# Patient Record
Sex: Female | Born: 1970 | Hispanic: Refuse to answer | Marital: Married | Smoking: Never smoker
Health system: Southern US, Community
[De-identification: ages and names within clinical notes are randomized; demographics above are authoritative.]

## PROBLEM LIST (undated history)

## (undated) DIAGNOSIS — Z862 Personal history of diseases of the blood and blood-forming organs and certain disorders involving the immune mechanism: Secondary | ICD-10-CM

## (undated) DIAGNOSIS — F419 Anxiety disorder, unspecified: Secondary | ICD-10-CM

## (undated) HISTORY — DX: Anxiety disorder, unspecified: F41.9

## (undated) HISTORY — PX: LYMPH NODE DISSECTION: SHX5087

## (undated) HISTORY — PX: KNEE ARTHROSCOPY: SUR90

## (undated) HISTORY — PX: ABDOMINAL HERNIA REPAIR: SHX539

## (undated) HISTORY — PX: TUBAL LIGATION: SHX77

---

## 2019-07-04 DIAGNOSIS — D225 Melanocytic nevi of trunk: Secondary | ICD-10-CM | POA: Diagnosis not present

## 2019-07-04 DIAGNOSIS — D2372 Other benign neoplasm of skin of left lower limb, including hip: Secondary | ICD-10-CM | POA: Diagnosis not present

## 2019-07-04 DIAGNOSIS — L821 Other seborrheic keratosis: Secondary | ICD-10-CM | POA: Diagnosis not present

## 2019-07-04 DIAGNOSIS — D1801 Hemangioma of skin and subcutaneous tissue: Secondary | ICD-10-CM | POA: Diagnosis not present

## 2019-08-28 DIAGNOSIS — Z1211 Encounter for screening for malignant neoplasm of colon: Secondary | ICD-10-CM | POA: Diagnosis not present

## 2019-09-11 DIAGNOSIS — M25511 Pain in right shoulder: Secondary | ICD-10-CM | POA: Diagnosis not present

## 2019-09-11 DIAGNOSIS — M546 Pain in thoracic spine: Secondary | ICD-10-CM | POA: Diagnosis not present

## 2019-09-11 DIAGNOSIS — M7521 Bicipital tendinitis, right shoulder: Secondary | ICD-10-CM | POA: Diagnosis not present

## 2019-09-25 DIAGNOSIS — S46011D Strain of muscle(s) and tendon(s) of the rotator cuff of right shoulder, subsequent encounter: Secondary | ICD-10-CM | POA: Diagnosis not present

## 2019-09-25 DIAGNOSIS — S46811D Strain of other muscles, fascia and tendons at shoulder and upper arm level, right arm, subsequent encounter: Secondary | ICD-10-CM | POA: Diagnosis not present

## 2019-09-25 DIAGNOSIS — M546 Pain in thoracic spine: Secondary | ICD-10-CM | POA: Diagnosis not present

## 2019-09-27 ENCOUNTER — Other Ambulatory Visit: Payer: Self-pay | Admitting: Obstetrics and Gynecology

## 2019-09-27 DIAGNOSIS — Z1151 Encounter for screening for human papillomavirus (HPV): Secondary | ICD-10-CM | POA: Diagnosis not present

## 2019-09-27 DIAGNOSIS — Z01419 Encounter for gynecological examination (general) (routine) without abnormal findings: Secondary | ICD-10-CM | POA: Diagnosis not present

## 2019-09-27 DIAGNOSIS — N6002 Solitary cyst of left breast: Secondary | ICD-10-CM

## 2019-09-27 DIAGNOSIS — Z124 Encounter for screening for malignant neoplasm of cervix: Secondary | ICD-10-CM | POA: Diagnosis not present

## 2019-09-27 DIAGNOSIS — Z6824 Body mass index (BMI) 24.0-24.9, adult: Secondary | ICD-10-CM | POA: Diagnosis not present

## 2019-09-27 DIAGNOSIS — Z130 Encounter for screening for diseases of the blood and blood-forming organs and certain disorders involving the immune mechanism: Secondary | ICD-10-CM | POA: Diagnosis not present

## 2019-09-27 DIAGNOSIS — Z3202 Encounter for pregnancy test, result negative: Secondary | ICD-10-CM | POA: Diagnosis not present

## 2019-10-01 DIAGNOSIS — S46011D Strain of muscle(s) and tendon(s) of the rotator cuff of right shoulder, subsequent encounter: Secondary | ICD-10-CM | POA: Diagnosis not present

## 2019-10-01 DIAGNOSIS — S46811D Strain of other muscles, fascia and tendons at shoulder and upper arm level, right arm, subsequent encounter: Secondary | ICD-10-CM | POA: Diagnosis not present

## 2019-10-01 DIAGNOSIS — M546 Pain in thoracic spine: Secondary | ICD-10-CM | POA: Diagnosis not present

## 2019-10-02 DIAGNOSIS — M25511 Pain in right shoulder: Secondary | ICD-10-CM | POA: Diagnosis not present

## 2019-10-03 DIAGNOSIS — Z1211 Encounter for screening for malignant neoplasm of colon: Secondary | ICD-10-CM | POA: Diagnosis not present

## 2019-10-05 DIAGNOSIS — S46811D Strain of other muscles, fascia and tendons at shoulder and upper arm level, right arm, subsequent encounter: Secondary | ICD-10-CM | POA: Diagnosis not present

## 2019-10-05 DIAGNOSIS — S46011D Strain of muscle(s) and tendon(s) of the rotator cuff of right shoulder, subsequent encounter: Secondary | ICD-10-CM | POA: Diagnosis not present

## 2019-10-05 DIAGNOSIS — M546 Pain in thoracic spine: Secondary | ICD-10-CM | POA: Diagnosis not present

## 2019-10-12 DIAGNOSIS — S46811D Strain of other muscles, fascia and tendons at shoulder and upper arm level, right arm, subsequent encounter: Secondary | ICD-10-CM | POA: Diagnosis not present

## 2019-10-12 DIAGNOSIS — M546 Pain in thoracic spine: Secondary | ICD-10-CM | POA: Diagnosis not present

## 2019-10-12 DIAGNOSIS — S46011D Strain of muscle(s) and tendon(s) of the rotator cuff of right shoulder, subsequent encounter: Secondary | ICD-10-CM | POA: Diagnosis not present

## 2019-10-22 ENCOUNTER — Other Ambulatory Visit: Payer: Self-pay

## 2019-10-23 ENCOUNTER — Ambulatory Visit
Admission: RE | Admit: 2019-10-23 | Discharge: 2019-10-23 | Disposition: A | Discharge status: Home / Self Care | Payer: BC Managed Care – PPO | Source: Ambulatory Visit | Attending: Obstetrics and Gynecology | Admitting: Obstetrics and Gynecology

## 2019-10-23 ENCOUNTER — Other Ambulatory Visit: Payer: Self-pay

## 2019-10-23 DIAGNOSIS — N6002 Solitary cyst of left breast: Secondary | ICD-10-CM

## 2019-10-23 DIAGNOSIS — S46811D Strain of other muscles, fascia and tendons at shoulder and upper arm level, right arm, subsequent encounter: Secondary | ICD-10-CM | POA: Diagnosis not present

## 2019-10-23 DIAGNOSIS — D171 Benign lipomatous neoplasm of skin and subcutaneous tissue of trunk: Secondary | ICD-10-CM | POA: Diagnosis not present

## 2019-10-23 DIAGNOSIS — S46011D Strain of muscle(s) and tendon(s) of the rotator cuff of right shoulder, subsequent encounter: Secondary | ICD-10-CM | POA: Diagnosis not present

## 2019-10-23 DIAGNOSIS — M546 Pain in thoracic spine: Secondary | ICD-10-CM | POA: Diagnosis not present

## 2019-10-30 DIAGNOSIS — S46011D Strain of muscle(s) and tendon(s) of the rotator cuff of right shoulder, subsequent encounter: Secondary | ICD-10-CM | POA: Diagnosis not present

## 2019-10-30 DIAGNOSIS — S46811D Strain of other muscles, fascia and tendons at shoulder and upper arm level, right arm, subsequent encounter: Secondary | ICD-10-CM | POA: Diagnosis not present

## 2019-10-30 DIAGNOSIS — M546 Pain in thoracic spine: Secondary | ICD-10-CM | POA: Diagnosis not present

## 2019-11-05 DIAGNOSIS — Z13220 Encounter for screening for lipoid disorders: Secondary | ICD-10-CM | POA: Diagnosis not present

## 2019-11-05 DIAGNOSIS — Z23 Encounter for immunization: Secondary | ICD-10-CM | POA: Diagnosis not present

## 2019-11-05 DIAGNOSIS — Z0000 Encounter for general adult medical examination without abnormal findings: Secondary | ICD-10-CM | POA: Diagnosis not present

## 2019-12-17 DIAGNOSIS — M4120 Other idiopathic scoliosis, site unspecified: Secondary | ICD-10-CM | POA: Diagnosis not present

## 2019-12-17 DIAGNOSIS — R6889 Other general symptoms and signs: Secondary | ICD-10-CM | POA: Diagnosis not present

## 2019-12-17 DIAGNOSIS — G8929 Other chronic pain: Secondary | ICD-10-CM | POA: Diagnosis not present

## 2019-12-17 DIAGNOSIS — M25511 Pain in right shoulder: Secondary | ICD-10-CM | POA: Diagnosis not present

## 2019-12-31 DIAGNOSIS — M419 Scoliosis, unspecified: Secondary | ICD-10-CM | POA: Diagnosis not present

## 2019-12-31 DIAGNOSIS — M546 Pain in thoracic spine: Secondary | ICD-10-CM | POA: Diagnosis not present

## 2019-12-31 DIAGNOSIS — M7541 Impingement syndrome of right shoulder: Secondary | ICD-10-CM | POA: Diagnosis not present

## 2019-12-31 DIAGNOSIS — M25511 Pain in right shoulder: Secondary | ICD-10-CM | POA: Diagnosis not present

## 2020-03-17 DIAGNOSIS — M7521 Bicipital tendinitis, right shoulder: Secondary | ICD-10-CM | POA: Diagnosis not present

## 2020-03-17 DIAGNOSIS — M419 Scoliosis, unspecified: Secondary | ICD-10-CM | POA: Diagnosis not present

## 2020-03-27 DIAGNOSIS — M25511 Pain in right shoulder: Secondary | ICD-10-CM | POA: Diagnosis not present

## 2020-04-01 DIAGNOSIS — M419 Scoliosis, unspecified: Secondary | ICD-10-CM | POA: Diagnosis not present

## 2020-04-01 DIAGNOSIS — M7521 Bicipital tendinitis, right shoulder: Secondary | ICD-10-CM | POA: Diagnosis not present

## 2020-04-01 DIAGNOSIS — M25511 Pain in right shoulder: Secondary | ICD-10-CM | POA: Diagnosis not present

## 2020-05-05 DIAGNOSIS — M25511 Pain in right shoulder: Secondary | ICD-10-CM | POA: Diagnosis not present

## 2020-07-10 DIAGNOSIS — L821 Other seborrheic keratosis: Secondary | ICD-10-CM | POA: Diagnosis not present

## 2020-07-10 DIAGNOSIS — D225 Melanocytic nevi of trunk: Secondary | ICD-10-CM | POA: Diagnosis not present

## 2020-07-10 DIAGNOSIS — L814 Other melanin hyperpigmentation: Secondary | ICD-10-CM | POA: Diagnosis not present

## 2020-07-10 DIAGNOSIS — L853 Xerosis cutis: Secondary | ICD-10-CM | POA: Diagnosis not present

## 2021-04-17 ENCOUNTER — Other Ambulatory Visit: Payer: Self-pay | Admitting: Obstetrics and Gynecology

## 2021-04-17 DIAGNOSIS — Z6822 Body mass index (BMI) 22.0-22.9, adult: Secondary | ICD-10-CM | POA: Diagnosis not present

## 2021-04-17 DIAGNOSIS — Z1151 Encounter for screening for human papillomavirus (HPV): Secondary | ICD-10-CM | POA: Diagnosis not present

## 2021-04-17 DIAGNOSIS — Z01419 Encounter for gynecological examination (general) (routine) without abnormal findings: Secondary | ICD-10-CM | POA: Diagnosis not present

## 2021-04-17 DIAGNOSIS — Z130 Encounter for screening for diseases of the blood and blood-forming organs and certain disorders involving the immune mechanism: Secondary | ICD-10-CM | POA: Diagnosis not present

## 2021-04-17 DIAGNOSIS — Z124 Encounter for screening for malignant neoplasm of cervix: Secondary | ICD-10-CM | POA: Diagnosis not present

## 2021-04-17 DIAGNOSIS — N6320 Unspecified lump in the left breast, unspecified quadrant: Secondary | ICD-10-CM

## 2021-05-18 ENCOUNTER — Ambulatory Visit
Admission: RE | Admit: 2021-05-18 | Discharge: 2021-05-18 | Disposition: A | Discharge status: Home / Self Care | Payer: BC Managed Care – PPO | Source: Ambulatory Visit | Attending: Obstetrics and Gynecology | Admitting: Obstetrics and Gynecology

## 2021-05-18 ENCOUNTER — Other Ambulatory Visit: Payer: Self-pay

## 2021-05-18 DIAGNOSIS — N6320 Unspecified lump in the left breast, unspecified quadrant: Secondary | ICD-10-CM

## 2021-05-18 DIAGNOSIS — R922 Inconclusive mammogram: Secondary | ICD-10-CM | POA: Diagnosis not present

## 2021-10-17 IMAGING — MG DIGITAL DIAGNOSTIC BILAT W/ TOMO W/ CAD
6 of 12 series · 6 of 36 positions shown · non-contrast
Comparison: Previous exam(s).

CLINICAL DATA: 49-year-old female with previously demonstrated left
breast lipoma presents with questionable enlargement of the palpable
area.

EXAM:
DIGITAL DIAGNOSTIC BILATERAL MAMMOGRAM WITH TOMOSYNTHESIS AND CAD;
ULTRASOUND LEFT BREAST LIMITED
TECHNIQUE: Bilateral digital diagnostic mammography and breast tomosynthesis
was performed. The images were evaluated with computer-aided
detection.; Targeted ultrasound examination of the left breast was
performed

[L MLO synth-2D (1 of 2)]
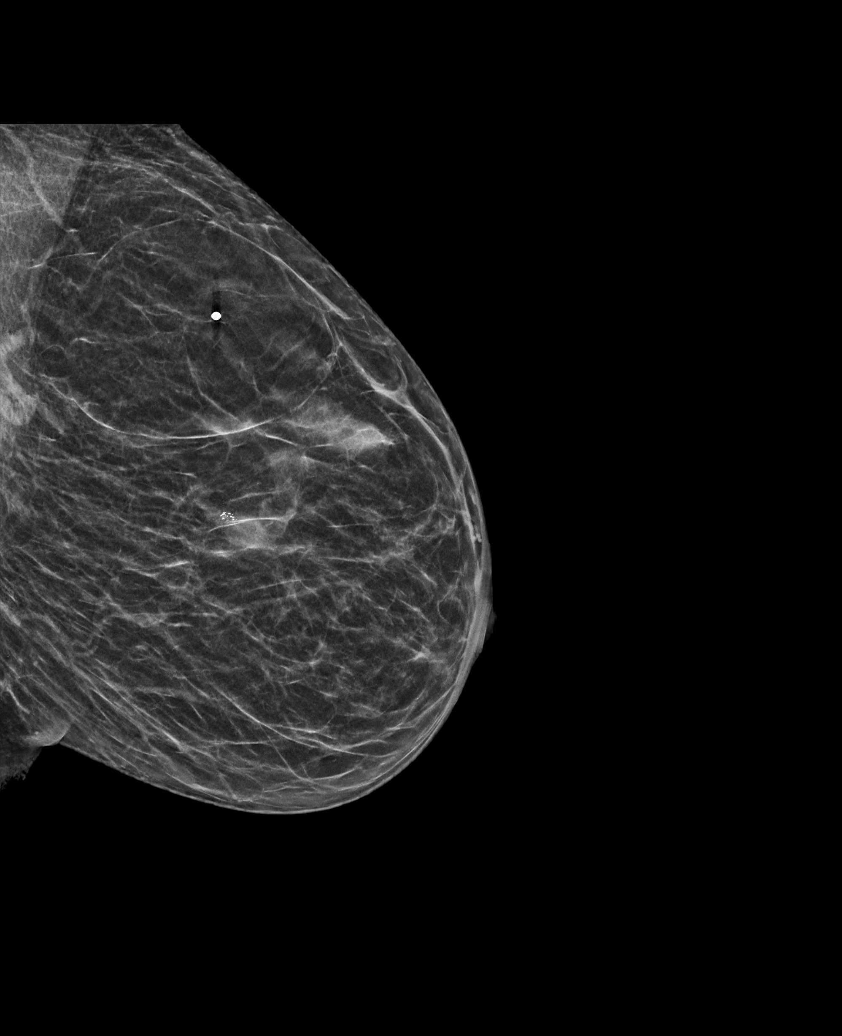

[L TAN synth-2D]
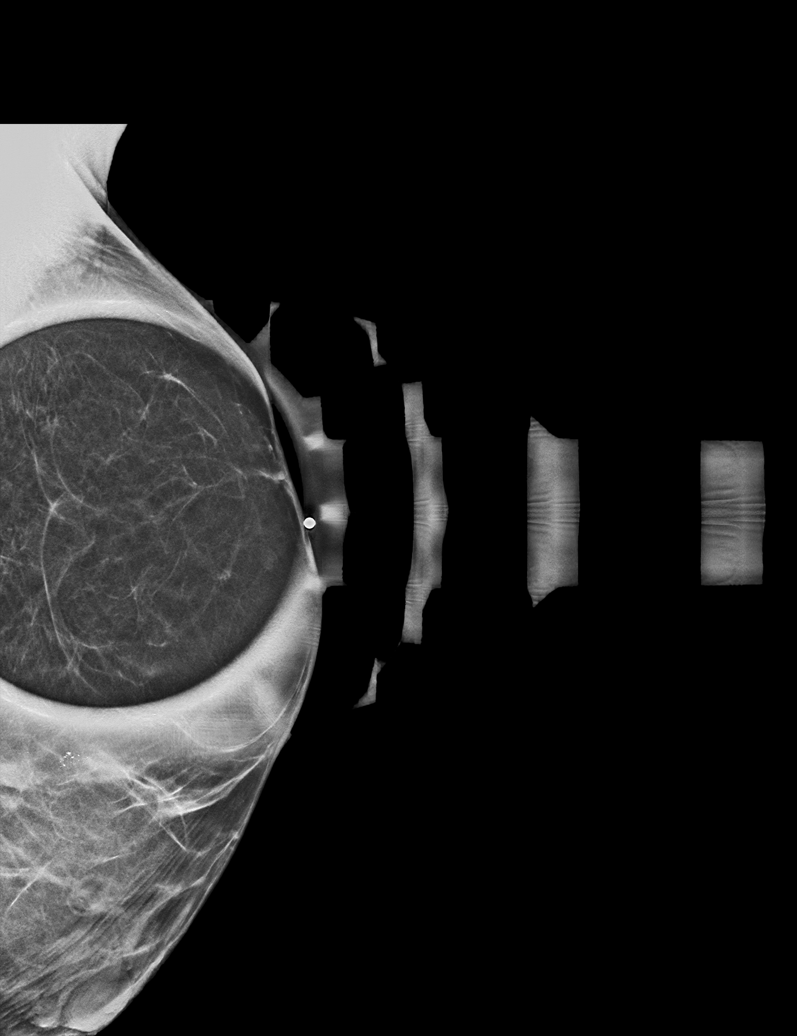

[L MLO synth-2D (2 of 2)]
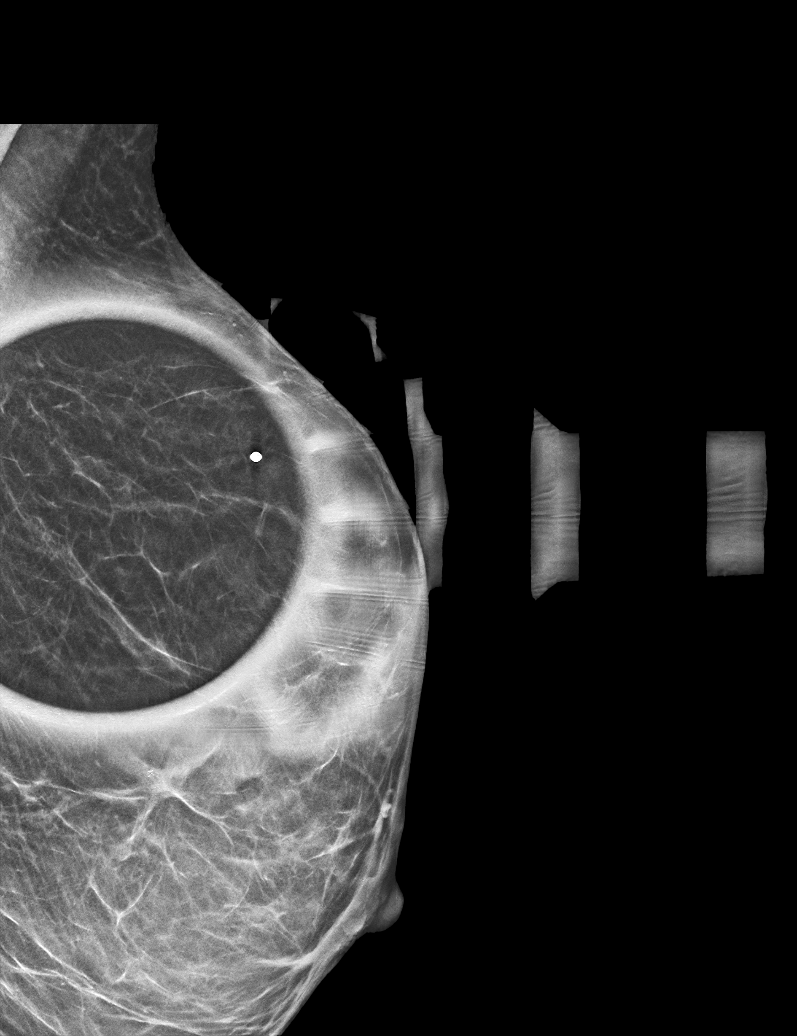

[R MLO synth-2D]
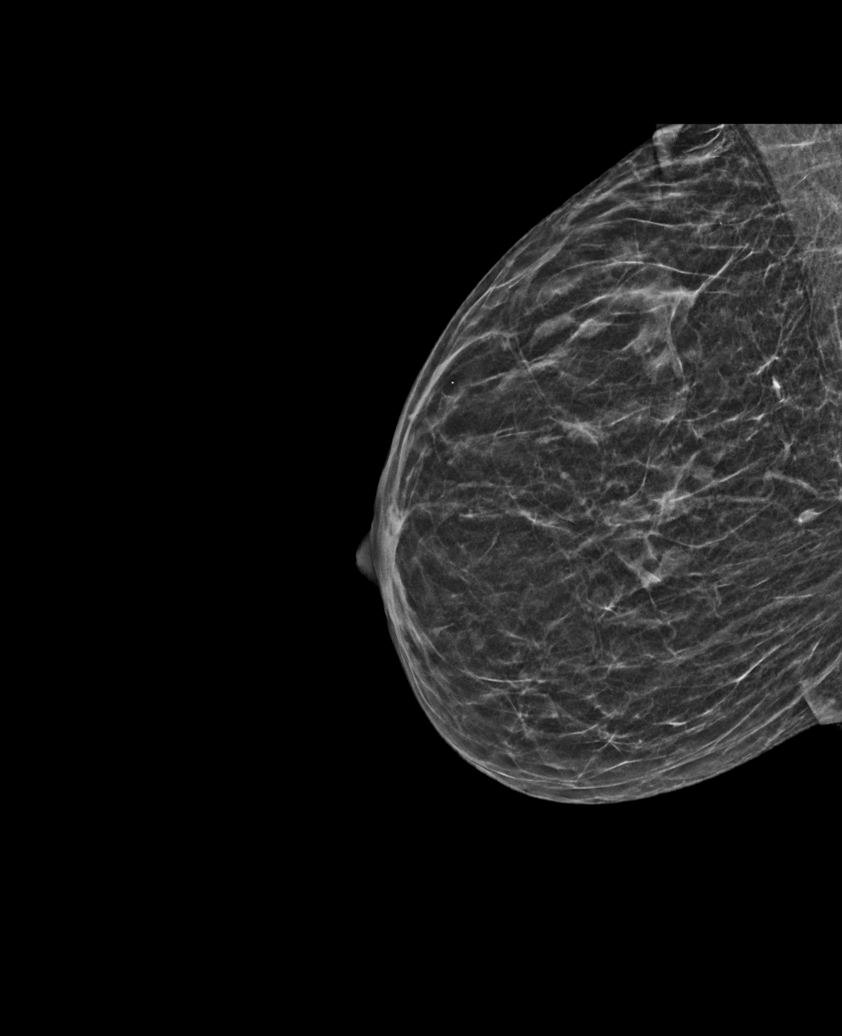

[R CC synth-2D]
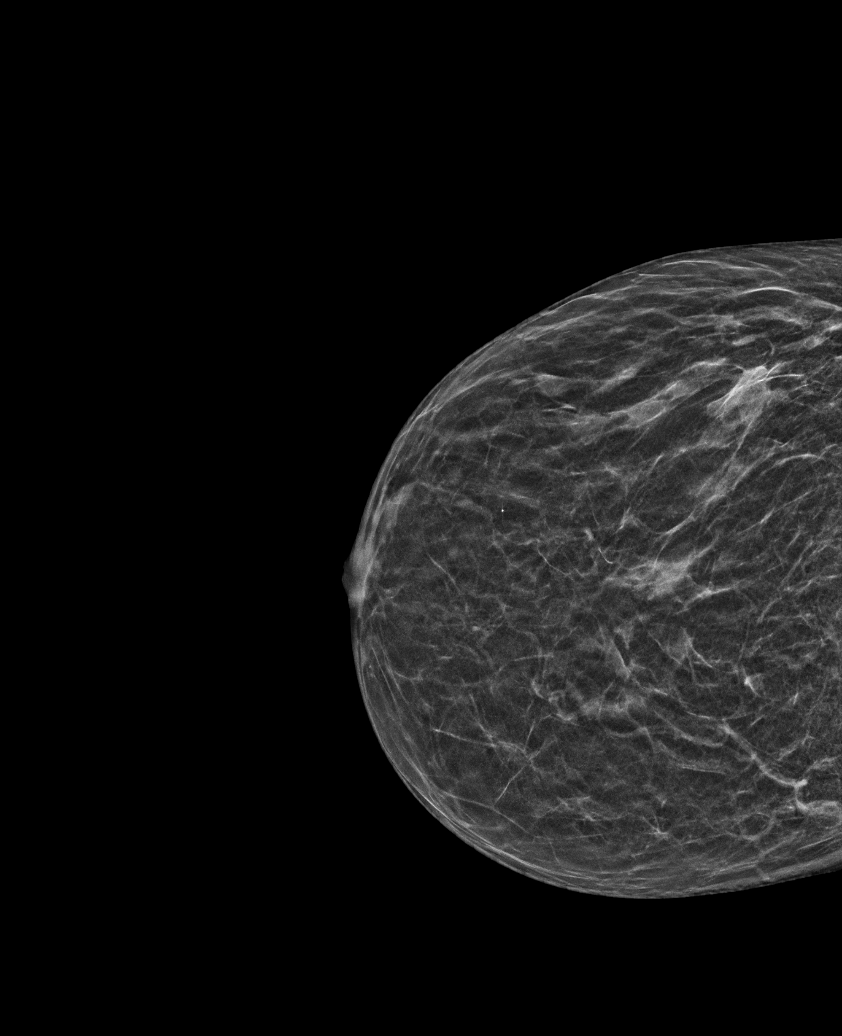

[L CC synth-2D]
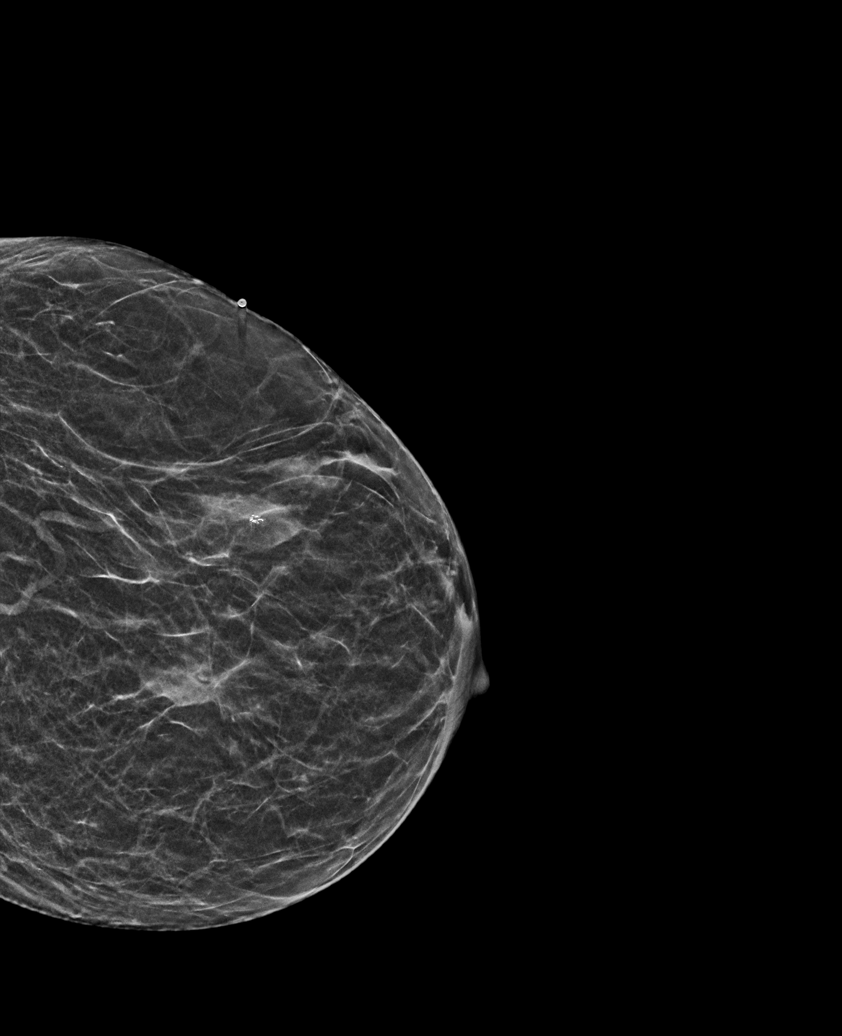

[6 of 36 positions shown; findings below may reference images not displayed]

ACR Breast Density Category b: There are scattered areas of
fibroglandular density.
FINDINGS: A radiopaque BB was placed at the site of the patient's palpable
left breast lump. An oval, circumscribed fat density mass,
consistent with a lipoma, is seen deep to the radiopaque BB. Is
mammographically unchanged from priors. No other new or suspicious
findings in either breast.

Targeted ultrasound is performed, showing a circumscribed mass at
the 2 o'clock position 8 cm from the nipple. It measures 0.6 x 1.8 x
6.1 cm (previously 5.6 x 2.8 x 7.2 cm).
IMPRESSION: 1. No mammographic evidence of malignancy in either breast.
2. Benign left breast lipoma, unchanged in size.

RECOMMENDATION:
1. Clinical follow-up recommended for the patient's left breast
mass.
2.  Screening mammogram in one year.(Code:BE-9-78W)

I have discussed the findings and recommendations with the patient.
If applicable, a reminder letter will be sent to the patient
regarding the next appointment.

BI-RADS CATEGORY  2: Benign.

## 2021-10-26 DIAGNOSIS — L821 Other seborrheic keratosis: Secondary | ICD-10-CM | POA: Diagnosis not present

## 2021-10-26 DIAGNOSIS — D235 Other benign neoplasm of skin of trunk: Secondary | ICD-10-CM | POA: Diagnosis not present

## 2021-10-26 DIAGNOSIS — L814 Other melanin hyperpigmentation: Secondary | ICD-10-CM | POA: Diagnosis not present

## 2022-06-08 DIAGNOSIS — R5383 Other fatigue: Secondary | ICD-10-CM | POA: Diagnosis not present

## 2022-06-08 DIAGNOSIS — Z130 Encounter for screening for diseases of the blood and blood-forming organs and certain disorders involving the immune mechanism: Secondary | ICD-10-CM | POA: Diagnosis not present

## 2022-06-08 DIAGNOSIS — Z1389 Encounter for screening for other disorder: Secondary | ICD-10-CM | POA: Diagnosis not present

## 2022-06-08 DIAGNOSIS — N6320 Unspecified lump in the left breast, unspecified quadrant: Secondary | ICD-10-CM | POA: Diagnosis not present

## 2022-06-08 DIAGNOSIS — Z01419 Encounter for gynecological examination (general) (routine) without abnormal findings: Secondary | ICD-10-CM | POA: Diagnosis not present

## 2022-06-10 ENCOUNTER — Other Ambulatory Visit: Payer: Self-pay | Admitting: Obstetrics and Gynecology

## 2022-06-10 DIAGNOSIS — N63 Unspecified lump in unspecified breast: Secondary | ICD-10-CM

## 2022-06-20 NOTE — Progress Notes (Unsigned)
   New Patient Office Visit  Subjective    Patient ID: Jillian Howard, female    DOB: October 18, 1971  Age: 51 y.o. MRN: [id number]  CC: No chief complaint on file.   HPI Jillian Howard presents to establish care ***  No outpatient encounter medications on file as of 06/21/2022.   No facility-administered encounter medications on file as of 06/21/2022.    No past medical history on file.  No past surgical history on file.  Family History  Problem Relation Age of Onset   Breast cancer Neg Hx     Social History   Socioeconomic History   Marital status: Married    Spouse name: Not on file   Number of children: Not on file   Years of education: Not on file   Highest education level: Not on file  Occupational History   Not on file  Tobacco Use   Smoking status: Not on file   Smokeless tobacco: Not on file  Substance and Sexual Activity   Alcohol use: Not on file   Drug use: Not on file   Sexual activity: Not on file  Other Topics Concern   Not on file  Social History Narrative   Not on file   Social Determinants of Health   Financial Resource Strain: Not on file  Food Insecurity: Not on file  Transportation Needs: Not on file  Physical Activity: Not on file  Stress: Not on file  Social Connections: Not on file  Intimate Partner Violence: Not on file    ROS      Objective    There were no vitals taken for this visit.  Physical Exam  {Labs (Optional):23779}    Assessment & Plan:   Problem List Items Addressed This Visit   None   No follow-ups on file.   Charlton Amor, DO

## 2022-06-21 ENCOUNTER — Ambulatory Visit: Payer: BC Managed Care – PPO | Admitting: Family Medicine

## 2022-06-21 ENCOUNTER — Encounter: Payer: Self-pay | Admitting: Family Medicine

## 2022-06-21 VITALS — BP 94/62 | HR 69 | Ht 62.5 in | Wt 147.0 lb

## 2022-06-21 DIAGNOSIS — R635 Abnormal weight gain: Secondary | ICD-10-CM

## 2022-06-21 DIAGNOSIS — R5383 Other fatigue: Secondary | ICD-10-CM | POA: Diagnosis not present

## 2022-06-21 DIAGNOSIS — F439 Reaction to severe stress, unspecified: Secondary | ICD-10-CM

## 2022-06-21 DIAGNOSIS — E559 Vitamin D deficiency, unspecified: Secondary | ICD-10-CM

## 2022-06-21 DIAGNOSIS — Z862 Personal history of diseases of the blood and blood-forming organs and certain disorders involving the immune mechanism: Secondary | ICD-10-CM

## 2022-06-21 NOTE — Assessment & Plan Note (Signed)
-   pt has hx of anemia - will order CBC and iron studies since she is currently not on any therapy  - also wondering if fatigue is related to low iron levels.

## 2022-06-21 NOTE — Assessment & Plan Note (Signed)
-   pt has hx of vit d deficiency and has not been taking any treatment coupled with fatigue am curious if this is what is causing low energy levels.

## 2022-06-21 NOTE — Assessment & Plan Note (Signed)
-   have ordered TSH and free T4  - likely weight gain is from little sleep--gets 3 hours a night, stressed with work and taking care of the children

## 2022-06-21 NOTE — Assessment & Plan Note (Signed)
-   pt in a high stress situation with work and I believe nausea is related - there is a likelihood that she has given herself a stress ulcer - recommend two weeks of ppi treatment to see if we can get symptom improvement. Vague nausea and some occasional abdominal pain point to ulcer - have given pt recommendations.  - discussed and counseled on ways to reduce stress--made a preliminary plan for her to call her mom and see if she can help  - have sent referral to psych for therapy

## 2022-06-22 DIAGNOSIS — D259 Leiomyoma of uterus, unspecified: Secondary | ICD-10-CM | POA: Diagnosis not present

## 2022-06-22 LAB — CBC
HCT: 37.5 % (ref 35.0–45.0)
Hemoglobin: 12.7 g/dL (ref 11.7–15.5)
MCH: 30.5 pg (ref 27.0–33.0)
MCHC: 33.9 g/dL (ref 32.0–36.0)
MCV: 90.1 fL (ref 80.0–100.0)
MPV: 11.5 fL (ref 7.5–12.5)
Platelets: 266 10*3/uL (ref 140–400)
RBC: 4.16 10*6/uL (ref 3.80–5.10)
RDW: 12.5 % (ref 11.0–15.0)
WBC: 6.9 10*3/uL (ref 3.8–10.8)

## 2022-06-22 LAB — TSH+FREE T4: TSH W/REFLEX TO FT4: 1.5 mIU/L

## 2022-06-22 LAB — IRON,TIBC AND FERRITIN PANEL
%SAT: 42 % (calc) (ref 16–45)
Ferritin: 33 ng/mL (ref 16–232)
Iron: 113 ug/dL (ref 45–160)
TIBC: 268 mcg/dL (calc) (ref 250–450)

## 2022-06-22 LAB — VITAMIN D 25 HYDROXY (VIT D DEFICIENCY, FRACTURES): Vit D, 25-Hydroxy: 42 ng/mL (ref 30–100)

## 2022-06-25 ENCOUNTER — Ambulatory Visit: Payer: BC Managed Care – PPO

## 2022-06-25 ENCOUNTER — Ambulatory Visit
Admission: RE | Admit: 2022-06-25 | Discharge: 2022-06-25 | Disposition: A | Discharge status: Home / Self Care | Payer: BC Managed Care – PPO | Source: Ambulatory Visit | Attending: Obstetrics and Gynecology | Admitting: Obstetrics and Gynecology

## 2022-06-25 DIAGNOSIS — R928 Other abnormal and inconclusive findings on diagnostic imaging of breast: Secondary | ICD-10-CM | POA: Diagnosis not present

## 2022-06-25 DIAGNOSIS — N63 Unspecified lump in unspecified breast: Secondary | ICD-10-CM

## 2022-07-09 ENCOUNTER — Other Ambulatory Visit: Payer: BC Managed Care – PPO

## 2022-09-23 ENCOUNTER — Ambulatory Visit: Payer: BC Managed Care – PPO | Admitting: Family Medicine

## 2023-06-14 ENCOUNTER — Other Ambulatory Visit: Payer: Self-pay | Admitting: Obstetrics and Gynecology

## 2023-06-14 DIAGNOSIS — N6320 Unspecified lump in the left breast, unspecified quadrant: Secondary | ICD-10-CM

## 2023-07-06 ENCOUNTER — Ambulatory Visit
Admission: RE | Admit: 2023-07-06 | Discharge: 2023-07-06 | Disposition: A | Discharge status: Home / Self Care | Payer: BC Managed Care – PPO | Source: Ambulatory Visit | Attending: Obstetrics and Gynecology

## 2023-07-06 DIAGNOSIS — N6320 Unspecified lump in the left breast, unspecified quadrant: Secondary | ICD-10-CM

## 2023-07-15 ENCOUNTER — Encounter: Payer: Self-pay | Admitting: Obstetrics and Gynecology

## 2023-07-15 ENCOUNTER — Other Ambulatory Visit: Payer: Self-pay | Admitting: Obstetrics and Gynecology

## 2023-07-15 DIAGNOSIS — N6320 Unspecified lump in the left breast, unspecified quadrant: Secondary | ICD-10-CM

## 2023-07-17 ENCOUNTER — Ambulatory Visit
Admission: RE | Admit: 2023-07-17 | Discharge: 2023-07-17 | Disposition: A | Discharge status: Home / Self Care | Payer: BC Managed Care – PPO | Source: Ambulatory Visit | Attending: Obstetrics and Gynecology | Admitting: Obstetrics and Gynecology

## 2023-07-17 DIAGNOSIS — N6320 Unspecified lump in the left breast, unspecified quadrant: Secondary | ICD-10-CM

## 2023-07-17 MED ORDER — GADOPICLENOL 0.5 MMOL/ML IV SOLN
7.0000 mL | Freq: Once | INTRAVENOUS | Status: AC | PRN
  Administered 2023-07-17: 7 mL via INTRAVENOUS

## 2023-08-02 ENCOUNTER — Ambulatory Visit: Payer: Self-pay | Admitting: General Surgery

## 2023-08-02 MED ORDER — KETOROLAC TROMETHAMINE 15 MG/ML IJ SOLN: 15.0000 mg | INTRAMUSCULAR | Status: AC

## 2023-08-19 NOTE — Pre-Procedure Instructions (Signed)
Surgical Instructions    Your procedure is scheduled on August 29, 2023.  Report to [facility] Main Entrance "A" at 6:30 A.M., then check in with the Admitting office.  Call this number if you have problems the morning of surgery:  [phone]   If you have any questions prior to your surgery date call [phone]: Open Monday-Friday 8am-4pm If you experience any cold or flu symptoms such as cough, fever, chills, shortness of breath, etc. between now and your scheduled surgery, please notify us at the above number     Remember:  Do not eat after midnight the night before your surgery  You may drink clear liquids until 5:30AM the morning of your surgery.   Clear liquids allowed are: Water, Non-Citrus Juices (without pulp), Carbonated Beverages, Clear Tea, Black Coffee ONLY (NO MILK, CREAM OR POWDERED CREAMER of any kind), and Gatorade    Take these medicines the morning of surgery with A SIP OF WATER: NONE   As of today, STOP taking any Aspirin (unless otherwise instructed by your surgeon) Aleve, Naproxen, Ibuprofen, Motrin, Advil, Goody's, BC's, all herbal medications, fish oil, and all vitamins.      [facility] is not responsible for any belongings or valuables.    Do NOT Smoke (Tobacco/Vaping)  24 hours prior to your procedure  If you use a CPAP at night, you may bring your mask for your overnight stay.   Contacts, glasses, hearing aids, dentures or partials may not be worn into surgery, please bring cases for these belongings   For patients admitted to the hospital, discharge time will be determined by your treatment team.   Patients discharged the day of surgery will not be allowed to drive home, and someone needs to stay with them for 24 hours.   SURGICAL WAITING ROOM VISITATION Patients having surgery or a procedure may have no more than 2 support people in the waiting area - these visitors may rotate.   Children under the age of 99 must have an adult with them who is  not the patient. If the patient needs to stay at the hospital during part of their recovery, the visitor guidelines for inpatient rooms apply. Pre-op nurse will coordinate an appropriate time for 1 support person to accompany patient in pre-op.  This support person may not rotate.   Please refer to [email or web address] for the visitor guidelines for Inpatients (after your surgery is over and you are in a regular room).    Special instructions:    Oral Hygiene is also important to reduce your risk of infection.  Remember - BRUSH YOUR TEETH THE MORNING OF SURGERY WITH YOUR REGULAR TOOTHPASTE   [facility]- Preparing For Surgery  Before surgery, you can play an important role. Because skin is not sterile, your skin needs to be as free of germs as possible. You can reduce the number of germs on your skin by washing with CHG (chlorahexidine gluconate) Soap before surgery.  CHG is an antiseptic cleaner which kills germs and bonds with the skin to continue killing germs even after washing.     Please do not use if you have an allergy to CHG or antibacterial soaps. If your skin becomes reddened/irritated stop using the CHG.  Do not shave (including legs and underarms) for at least 48 hours prior to first CHG shower. It is OK to shave your face.  Please follow these instructions carefully.     Shower the NIGHT BEFORE SURGERY and the MORNING OF SURGERY with  CHG Soap.   If you chose to wash your hair, wash your hair first as usual with your normal shampoo. After you shampoo, rinse your hair and body thoroughly to remove the shampoo.  Then [employer] and genitals (private parts) with your normal soap and rinse thoroughly to remove soap.  After that Use CHG Soap as you would any other liquid soap. You can apply CHG directly to the skin and wash gently with a scrungie or a clean washcloth.   Apply the CHG Soap to your body ONLY FROM THE NECK DOWN.  Do not  use on open wounds or open sores. Avoid contact with your eyes, ears, mouth and genitals (private parts). Wash Face and genitals (private parts)  with your normal soap.   Wash thoroughly, paying special attention to the area where your surgery will be performed.  Thoroughly rinse your body with warm water from the neck down.  DO NOT shower/wash with your normal soap after using and rinsing off the CHG Soap.  Pat yourself dry with a CLEAN TOWEL.  Wear CLEAN PAJAMAS to bed the night before surgery  Place CLEAN SHEETS on your bed the night before your surgery  DO NOT SLEEP WITH PETS.   Day of Surgery:  Take a shower with CHG soap. Wear Clean/Comfortable clothing the morning of surgery Do not wear jewelry or makeup. Do not wear lotions, powders, perfumes/cologne or deodorant. Do not shave 48 hours prior to surgery.  Men may shave face and neck. Do not bring valuables to the hospital. Do not wear nail polish, gel polish, artificial nails, or any other type of covering on natural nails (fingers and toes) If you have artificial nails or gel coating that need to be removed by a nail salon, please have this removed prior to surgery. Artificial nails or gel coating may interfere with anesthesia's ability to adequately monitor your vital signs.  Remember to brush your teeth WITH YOUR REGULAR TOOTHPASTE.    If you received a COVID test during your pre-op visit, it is requested that you wear a mask when out in public, stay away from anyone that may not be feeling well, and notify your surgeon if you develop symptoms. If you have been in contact with anyone that has tested positive in the last 10 days, please notify your surgeon.    Please read over the following fact sheets that you were given.

## 2023-08-22 ENCOUNTER — Other Ambulatory Visit: Payer: Self-pay

## 2023-08-22 ENCOUNTER — Encounter (HOSPITAL_COMMUNITY): Payer: Self-pay

## 2023-08-22 ENCOUNTER — Other Ambulatory Visit (HOSPITAL_COMMUNITY): Payer: BC Managed Care – PPO

## 2023-08-22 ENCOUNTER — Encounter (HOSPITAL_COMMUNITY)
Admission: RE | Admit: 2023-08-22 | Discharge: 2023-08-22 | Disposition: A | Discharge status: Home / Self Care | Payer: BC Managed Care – PPO | Source: Ambulatory Visit | Attending: General Surgery | Admitting: General Surgery

## 2023-08-22 VITALS — BP 96/60 | HR 50 | Temp 98.2°F | Resp 17 | Ht 62.5 in | Wt 128.4 lb

## 2023-08-22 DIAGNOSIS — Z01812 Encounter for preprocedural laboratory examination: Secondary | ICD-10-CM | POA: Insufficient documentation

## 2023-08-22 DIAGNOSIS — Z01818 Encounter for other preprocedural examination: Secondary | ICD-10-CM

## 2023-08-22 HISTORY — DX: Personal history of diseases of the blood and blood-forming organs and certain disorders involving the immune mechanism: Z86.2

## 2023-08-22 LAB — CBC
HCT: 36.7 % (ref 36.0–46.0)
Hemoglobin: 12.1 g/dL (ref 12.0–15.0)
MCH: 30.4 pg (ref 26.0–34.0)
MCHC: 33 g/dL (ref 30.0–36.0)
MCV: 92.2 fL (ref 80.0–100.0)
Platelets: 246 10*3/uL (ref 150–400)
RBC: 3.98 MIL/uL (ref 3.87–5.11)
RDW: 12.2 % (ref 11.5–15.5)
WBC: 7 10*3/uL (ref 4.0–10.5)
nRBC: 0 % (ref 0.0–0.2)

## 2023-08-22 NOTE — Progress Notes (Signed)
PCP - no PCP Cardiologist - Denies  PPM/ICD - denies   Chest x-ray - N/A EKG - 11/17/22 in c.e- requested-  Stress Test - denies ECHO - denies Cardiac Cath - denies  Sleep Study - denies   Fasting Blood Sugar - N/A   Last dose of GLP1 agonist- N/A     Blood Thinner Instructions: N/A Aspirin Instructions: N/A  ERAS Protcol - ERAS per order   COVID TEST- N/A   Anesthesia review: follow up requested EKG- pt reports she had this done at an urgent care when she was sick. She stated that she had been exposed to a Frion leak which caused her nausea etc. Pt denies any cardiac issues/palpitations etc normally. Pt states her HR is normally on the lower side. Pulse 50 at PAT.   Patient denies shortness of breath, fever, cough and chest pain at PAT appointment   All instructions explained to the patient, with a verbal understanding of the material. Patient agrees to go over the instructions while at home for a better understanding.  The opportunity to ask questions was provided.

## 2023-08-28 NOTE — Anesthesia Preprocedure Evaluation (Signed)
Anesthesia Evaluation  Patient identified by MRN, date of birth, ID band Patient awake    Reviewed: Allergy & Precautions, H&P , NPO status , Patient's Chart, lab work & pertinent test results  Airway Mallampati: II  TM Distance: >3 FB Neck ROM: Full    Dental no notable dental hx.    Pulmonary neg pulmonary ROS   Pulmonary exam normal breath sounds clear to auscultation       Cardiovascular negative cardio ROS Normal cardiovascular exam Rhythm:Regular Rate:Normal     Neuro/Psych   Anxiety     negative neurological ROS     GI/Hepatic negative GI ROS, Neg liver ROS,,,  Endo/Other  negative endocrine ROS    Renal/GU negative Renal ROS  negative genitourinary   Musculoskeletal negative musculoskeletal ROS (+)    Abdominal   Peds negative pediatric ROS (+)  Hematology negative hematology ROS (+)   Anesthesia Other Findings   Reproductive/Obstetrics negative OB ROS                             Anesthesia Physical Anesthesia Plan  ASA: 2  Anesthesia Plan: General   Post-op Pain Management:    Induction: Intravenous  PONV Risk Score and Plan: Ondansetron and Dexamethasone  Airway Management Planned: LMA  Additional Equipment:   Intra-op Plan:   Post-operative Plan: Extubation in OR  Informed Consent: I have reviewed the patients History and Physical, chart, labs and discussed the procedure including the risks, benefits and alternatives for the proposed anesthesia with the patient or authorized representative who has indicated his/her understanding and acceptance.     Dental advisory given  Plan Discussed with: CRNA  Anesthesia Plan Comments:        Anesthesia Quick Evaluation

## 2023-08-29 ENCOUNTER — Encounter (HOSPITAL_COMMUNITY)
Admission: RE | Disposition: A | Discharge status: Home / Self Care | Payer: Self-pay | Source: Home / Self Care | Attending: General Surgery

## 2023-08-29 ENCOUNTER — Other Ambulatory Visit: Payer: Self-pay

## 2023-08-29 ENCOUNTER — Encounter (HOSPITAL_COMMUNITY): Payer: Self-pay | Admitting: General Surgery

## 2023-08-29 ENCOUNTER — Ambulatory Visit (HOSPITAL_COMMUNITY)
Admission: RE | Admit: 2023-08-29 | Discharge: 2023-08-29 | Disposition: A | Discharge status: Home / Self Care | Payer: BC Managed Care – PPO | Attending: General Surgery | Admitting: General Surgery

## 2023-08-29 ENCOUNTER — Ambulatory Visit (HOSPITAL_COMMUNITY): Payer: Self-pay

## 2023-08-29 ENCOUNTER — Ambulatory Visit (HOSPITAL_COMMUNITY): Payer: BC Managed Care – PPO | Admitting: Physician Assistant

## 2023-08-29 DIAGNOSIS — Z803 Family history of malignant neoplasm of breast: Secondary | ICD-10-CM | POA: Diagnosis not present

## 2023-08-29 DIAGNOSIS — D1779 Benign lipomatous neoplasm of other sites: Secondary | ICD-10-CM | POA: Diagnosis present

## 2023-08-29 HISTORY — PX: BREAST LUMPECTOMY: SHX2

## 2023-08-29 SURGERY — BREAST LUMPECTOMY
Anesthesia: General | Site: Breast | Laterality: Left

## 2023-08-29 MED ORDER — OXYCODONE HCL 5 MG/5ML PO SOLN
5.0000 mg | Freq: Once | ORAL | Status: DC | PRN
Start: 2023-08-29 — End: 2023-08-29

## 2023-08-29 MED ORDER — BUPIVACAINE-EPINEPHRINE 0.25% -1:200000 IJ SOLN
INTRAMUSCULAR | Status: DC | PRN
  Administered 2023-08-29: 20 mL

## 2023-08-29 MED ORDER — LIDOCAINE 2% (20 MG/ML) 5 ML SYRINGE
INTRAMUSCULAR | Status: DC | PRN
  Administered 2023-08-29: 60 mg via INTRAVENOUS

## 2023-08-29 MED ORDER — ORAL CARE MOUTH RINSE: 15.0000 mL | Freq: Once | OROMUCOSAL | Status: AC

## 2023-08-29 MED ORDER — ONDANSETRON HCL 4 MG/2ML IJ SOLN
INTRAMUSCULAR | Status: DC | PRN
  Administered 2023-08-29: 4 mg via INTRAVENOUS

## 2023-08-29 MED ORDER — LIDOCAINE 2% (20 MG/ML) 5 ML SYRINGE
INTRAMUSCULAR | Status: AC
  Filled 2023-08-29: qty 5

## 2023-08-29 MED ORDER — CHLORHEXIDINE GLUCONATE CLOTH 2 % EX PADS: 6.0000 | MEDICATED_PAD | Freq: Once | CUTANEOUS | Status: DC

## 2023-08-29 MED ORDER — DROPERIDOL 2.5 MG/ML IJ SOLN: 0.6250 mg | Freq: Once | INTRAMUSCULAR | Status: DC | PRN

## 2023-08-29 MED ORDER — STERILE WATER FOR IRRIGATION IR SOLN
Status: DC | PRN
  Administered 2023-08-29: 1000 mL

## 2023-08-29 MED ORDER — ROCURONIUM BROMIDE 10 MG/ML (PF) SYRINGE
PREFILLED_SYRINGE | INTRAVENOUS | Status: AC
  Filled 2023-08-29: qty 10

## 2023-08-29 MED ORDER — BUPIVACAINE-EPINEPHRINE (PF) 0.25% -1:200000 IJ SOLN
INTRAMUSCULAR | Status: AC
  Filled 2023-08-29: qty 30

## 2023-08-29 MED ORDER — PROPOFOL 10 MG/ML IV BOLUS
INTRAVENOUS | Status: AC
  Filled 2023-08-29: qty 20

## 2023-08-29 MED ORDER — PROPOFOL 10 MG/ML IV BOLUS
INTRAVENOUS | Status: DC | PRN
  Administered 2023-08-29: 120 mg via INTRAVENOUS

## 2023-08-29 MED ORDER — LACTATED RINGERS IV SOLN
INTRAVENOUS | Status: DC
Start: 2023-08-29 — End: 2023-08-29

## 2023-08-29 MED ORDER — OXYCODONE HCL 5 MG PO TABS: 5.0000 mg | ORAL_TABLET | Freq: Once | ORAL | Status: DC | PRN

## 2023-08-29 MED ORDER — FENTANYL CITRATE (PF) 250 MCG/5ML IJ SOLN
INTRAMUSCULAR | Status: DC | PRN
  Administered 2023-08-29 (×2): 50 ug via INTRAVENOUS

## 2023-08-29 MED ORDER — ACETAMINOPHEN 10 MG/ML IV SOLN: 1000.0000 mg | Freq: Once | INTRAVENOUS | Status: DC | PRN

## 2023-08-29 MED ORDER — DEXAMETHASONE SODIUM PHOSPHATE 10 MG/ML IJ SOLN
INTRAMUSCULAR | Status: DC | PRN
  Administered 2023-08-29: 10 mg via INTRAVENOUS

## 2023-08-29 MED ORDER — FENTANYL CITRATE (PF) 100 MCG/2ML IJ SOLN: 25.0000 ug | INTRAMUSCULAR | Status: DC | PRN

## 2023-08-29 MED ORDER — CEFAZOLIN SODIUM-DEXTROSE 2-4 GM/100ML-% IV SOLN
2.0000 g | INTRAVENOUS | Status: AC
  Administered 2023-08-29: 2 g via INTRAVENOUS
  Filled 2023-08-29: qty 100

## 2023-08-29 MED ORDER — ONDANSETRON HCL 4 MG/2ML IJ SOLN
INTRAMUSCULAR | Status: AC
  Filled 2023-08-29: qty 2

## 2023-08-29 MED ORDER — DEXAMETHASONE SODIUM PHOSPHATE 10 MG/ML IJ SOLN
INTRAMUSCULAR | Status: AC
  Filled 2023-08-29: qty 1

## 2023-08-29 MED ORDER — CHLORHEXIDINE GLUCONATE 0.12 % MT SOLN
15.0000 mL | Freq: Once | OROMUCOSAL | Status: AC
  Administered 2023-08-29: 15 mL via OROMUCOSAL
  Filled 2023-08-29: qty 15

## 2023-08-29 MED ORDER — OXYCODONE HCL 5 MG PO TABS: 5.0000 mg | ORAL_TABLET | Freq: Four times a day (QID) | ORAL | 0 refills | Status: AC | PRN

## 2023-08-29 MED ORDER — FENTANYL CITRATE (PF) 250 MCG/5ML IJ SOLN
INTRAMUSCULAR | Status: AC
  Filled 2023-08-29: qty 5

## 2023-08-29 MED ORDER — GABAPENTIN 100 MG PO CAPS
100.0000 mg | ORAL_CAPSULE | ORAL | Status: AC
  Administered 2023-08-29: 100 mg via ORAL
  Filled 2023-08-29: qty 1

## 2023-08-29 MED ORDER — ACETAMINOPHEN 500 MG PO TABS
1000.0000 mg | ORAL_TABLET | ORAL | Status: AC
  Administered 2023-08-29: 1000 mg via ORAL
  Filled 2023-08-29: qty 2

## 2023-08-29 SURGICAL SUPPLY — 29 items
ADH SKN CLS APL DERMABOND .7 (GAUZE/BANDAGES/DRESSINGS) ×1
APL PRP STRL LF DISP 70% ISPRP (MISCELLANEOUS) ×1
BAG COUNTER SPONGE SURGICOUNT (BAG) IMPLANT
BAG SPNG CNTER NS LX DISP (BAG)
BINDER BREAST LRG (GAUZE/BANDAGES/DRESSINGS) IMPLANT
BINDER BREAST XLRG (GAUZE/BANDAGES/DRESSINGS) IMPLANT
CANISTER SUCT 3000ML PPV (MISCELLANEOUS) ×1 IMPLANT
CHLORAPREP W/TINT 26 (MISCELLANEOUS) ×1 IMPLANT
COVER PROBE W GEL 5X96 (DRAPES) ×1 IMPLANT
COVER SURGICAL LIGHT HANDLE (MISCELLANEOUS) ×1 IMPLANT
DERMABOND ADVANCED .7 DNX12 (GAUZE/BANDAGES/DRESSINGS) ×1 IMPLANT
DEVICE DUBIN SPECIMEN MAMMOGRA (MISCELLANEOUS) ×1 IMPLANT
DRAPE CHEST BREAST 15X10 FENES (DRAPES) ×1 IMPLANT
ELECT COATED BLADE 2.86 ST (ELECTRODE) ×1 IMPLANT
ELECT REM PT RETURN 9FT ADLT (ELECTROSURGICAL) ×1
ELECTRODE REM PT RTRN 9FT ADLT (ELECTROSURGICAL) ×1 IMPLANT
GLOVE BIO SURGEON STRL SZ7.5 (GLOVE) ×2 IMPLANT
GOWN STRL REUS W/ TWL LRG LVL3 (GOWN DISPOSABLE) ×2 IMPLANT
GOWN STRL REUS W/TWL LRG LVL3 (GOWN DISPOSABLE) ×2
KIT BASIN OR (CUSTOM PROCEDURE TRAY) ×1 IMPLANT
NDL HYPO 25GX1X1/2 BEV (NEEDLE) ×1 IMPLANT
NEEDLE HYPO 25GX1X1/2 BEV (NEEDLE) ×1
NS IRRIG 1000ML POUR BTL (IV SOLUTION) ×1 IMPLANT
PACK GENERAL/GYN (CUSTOM PROCEDURE TRAY) ×1 IMPLANT
SUT MNCRL AB 4-0 PS2 18 (SUTURE) ×1 IMPLANT
SUT VIC AB 3-0 SH 18 (SUTURE) ×1 IMPLANT
SYR CONTROL 10ML LL (SYRINGE) ×1 IMPLANT
TOWEL GREEN STERILE (TOWEL DISPOSABLE) ×1 IMPLANT
TOWEL GREEN STERILE FF (TOWEL DISPOSABLE) ×1 IMPLANT

## 2023-08-29 NOTE — Op Note (Signed)
08/29/2023  9:18 AM  PATIENT:  Jillian Howard  52 y.o. female  PRE-OPERATIVE DIAGNOSIS:  LEFT BREAST LIPOMA  POST-OPERATIVE DIAGNOSIS:  LEFT BREAST LIPOMA  PROCEDURE:  Procedure(s): LEFT BREAST LUMPECTOMY (Left)  SURGEON:  Surgeons and Role:    Griselda Miner, MD - Primary  PHYSICIAN ASSISTANT:   ASSISTANTS: none   ANESTHESIA:   local and general  EBL:  minimal   BLOOD ADMINISTERED:none  DRAINS: none   LOCAL MEDICATIONS USED:  MARCAINE     SPECIMEN:  Source of Specimen:  left breast lipoma  DISPOSITION OF SPECIMEN:  PATHOLOGY  COUNTS:  YES  TOURNIQUET:  * No tourniquets in log *  DICTATION: .Dragon Dictation  After informed consent was obtained the patient was brought to the operating room and placed in the supine position on the operating room table.  After adequate induction of general anesthesia the patient's left breast was prepped with ChloraPrep, allowed to dry, and draped in usual sterile manner.  An appropriate timeout was performed.  There was a palpable fatty mass in the outer aspect of the left breast.  The area around this was infiltrated with quarter percent Marcaine.  A vertically oriented curvilinear incision was made along the outer aspect of the left breast with a 15 blade knife.  The incision was carried through the skin and subcutaneous tissue sharply with the electrocautery until the fat was encountered that appeared to be a lipoma.  The lipomatous fat was separated from the rest of the breast tissue by combination of blunt hemostat dissection and some sharp dissection with the electrocautery.  Once the entire mass was removed it measured 9 x 7 cm.  Hemostasis was achieved using the Bovie electrocautery.  The cavity was examined and there were no remnants of the lipoma left.  The lipoma was then sent to pathology for further evaluation.  The wound was irrigated with saline and infiltrated with more quarter percent Marcaine.  The deep layer of the incision  was closed with interrupted 3-0 Vicryl stitches.  The skin was then closed with interrupted 4-0 Monocryl subcuticular stitches.  Dermabond dressings were applied.  The patient tolerated the procedure well.  At the end of the case all needle sponge and instrument counts were correct.  The patient was then awakened and taken to recovery in stable condition.  PLAN OF CARE: Discharge to home after PACU  PATIENT DISPOSITION:  PACU - hemodynamically stable.   Delay start of Pharmacological VTE agent (>24hrs) due to surgical blood loss or risk of bleeding: not applicable

## 2023-08-29 NOTE — Anesthesia Postprocedure Evaluation (Signed)
Anesthesia Post Note  Patient: Jillian Howard  Procedure(s) Performed: LEFT BREAST LUMPECTOMY (Left: Breast)     Patient location during evaluation: PACU Anesthesia Type: General Level of consciousness: awake and alert Pain management: pain level controlled Vital Signs Assessment: post-procedure vital signs reviewed and stable Respiratory status: spontaneous breathing, nonlabored ventilation, respiratory function stable and patient connected to nasal cannula oxygen Cardiovascular status: blood pressure returned to baseline and stable Postop Assessment: no apparent nausea or vomiting Anesthetic complications: no   There were no known notable events for this encounter.  Last Vitals:  Vitals:   08/29/23 0945 08/29/23 1000  BP: 110/65 107/71  Pulse: (!) 44 (!) 44  Resp: 13 20  Temp:  36.5 C  SpO2: 98% 97%    Last Pain:  Vitals:   08/29/23 0945  TempSrc:   PainSc: 0-No pain                 [place]

## 2023-08-29 NOTE — Interval H&P Note (Signed)
History and Physical Interval Note:  08/29/2023 7:51 AM  Jillian Howard  has presented today for surgery, with the diagnosis of LEFT BREAST LIPOMA.  The various methods of treatment have been discussed with the patient and family. After consideration of risks, benefits and other options for treatment, the patient has consented to  Procedure(s): LEFT BREAST LUMPECTOMY (Left) as a surgical intervention.  The patient's history has been reviewed, patient examined, no change in status, stable for surgery.  I have reviewed the patient's chart and labs.  Questions were answered to the patient's satisfaction.     Chevis Pretty III

## 2023-08-29 NOTE — Transfer of Care (Signed)
Immediate Anesthesia Transfer of Care Note  Patient: Jillian Howard  Procedure(s) Performed: LEFT BREAST LUMPECTOMY (Left: Breast)  Patient Location: PACU  Anesthesia Type:General  Level of Consciousness: awake, alert , and oriented  Airway & Oxygen Therapy: Patient Spontanous Breathing and Patient connected to face mask oxygen  Post-op Assessment: Report given to RN and Post -op Vital signs reviewed and stable  Post vital signs: Reviewed and stable  Last Vitals:  Vitals Value Taken Time  BP 112/62 08/29/23 0927  Temp    Pulse 60 08/29/23 0929  Resp 16 08/29/23 0930  SpO2 100 % 08/29/23 0929  Vitals shown include unfiled device data.  Last Pain:  Vitals:   08/29/23 0651  TempSrc:   PainSc: 5          Complications: No notable events documented.

## 2023-08-29 NOTE — Anesthesia Procedure Notes (Signed)
Procedure Name: LMA Insertion Date/Time: 08/29/2023 8:40 AM  Performed by: Allyn Kenner, CRNAPre-anesthesia Checklist: Patient identified, Emergency Drugs available, Suction available and Patient being monitored Patient Re-evaluated:Patient Re-evaluated prior to induction Oxygen Delivery Method: Circle System Utilized Preoxygenation: Pre-oxygenation with 100% oxygen Induction Type: IV induction LMA: LMA inserted LMA Size: 4.0 Number of attempts: 1 Placement Confirmation: positive ETCO2 and breath sounds checked- equal and bilateral Tube secured with: Tape Dental Injury: Teeth and Oropharynx as per pre-operative assessment

## 2023-08-29 NOTE — H&P (Signed)
REFERRING PHYSICIAN: Ellison Hughs, MD PROVIDER: Lindell Noe, MD MRN: [id number] DOB: 1971-08-14 Subjective   Chief Complaint: Breast Lipoma  History of Present Illness: Jillian Howard is a 52 y.o. female who is seen today as an office consultation for evaluation of Breast Lipoma  We are asked to see the patient in consultation by Dr. Reina Fuse to evaluate her for a lipoma of the left breast. The patient is a 52 year old white female who has felt a fatty mass in the lateral aspect of the left breast for the last 4 to 5 years. During that time it has gradually gotten larger. She denies any significant breast pain. She denies any discharge from the nipple. Her most recent mammogram and ultrasound estimated the size to be 6.6 cm and an ultrasound estimated the size at 10 cm. It did have an appearance of a typical lipoma. She is otherwise in good health and does not smoke. She has a family history of breast cancer in a maternal aunt  Review of Systems: A complete review of systems was obtained from the patient. I have reviewed this information and discussed as appropriate with the patient. See HPI as well for other ROS.  ROS   Medical History: History reviewed. No pertinent past medical history.  Patient Active Problem List  Diagnosis  Lipoma of breast   Past Surgical History:  Procedure Laterality Date  Knee Surgery 2003  CESAREAN SECTION  x3 2007, 2009, 2011    No Known Allergies  No current outpatient medications on file prior to visit.   No current facility-administered medications on file prior to visit.   Family History  Problem Relation Age of Onset  High blood pressure (Hypertension) Mother    Social History   Tobacco Use  Smoking Status Never  Smokeless Tobacco Never    Social History   Socioeconomic History  Marital status: Married  Tobacco Use  Smoking status: Never  Smokeless tobacco: Never  Substance and Sexual Activity  Alcohol use: Never  Drug  use: Never   Objective:   Vitals:  BP: 112/82  Pulse: 70  Temp: 36.7 C (98.1 F)  SpO2: 98%  Weight: 58.2 kg (128 lb 3.2 oz)  Height: 157.5 cm (5\' 2" )  PainSc: 2   Body mass index is 23.45 kg/m.  Physical Exam Vitals reviewed.  Constitutional:  General: She is not in acute distress. Appearance: Normal appearance.  HENT:  Head: Normocephalic and atraumatic.  Right Ear: External ear normal.  Left Ear: External ear normal.  Nose: Nose normal.  Mouth/Throat:  Mouth: Mucous membranes are moist.  Pharynx: Oropharynx is clear.  Eyes:  General: No scleral icterus. Extraocular Movements: Extraocular movements intact.  Conjunctiva/sclera: Conjunctivae normal.  Pupils: Pupils are equal, round, and reactive to light.  Cardiovascular:  Rate and Rhythm: Normal rate and regular rhythm.  Pulses: Normal pulses.  Heart sounds: Normal heart sounds.  Pulmonary:  Effort: Pulmonary effort is normal. No respiratory distress.  Breath sounds: Normal breath sounds.  Abdominal:  General: Bowel sounds are normal.  Palpations: Abdomen is soft.  Tenderness: There is no abdominal tenderness.  Musculoskeletal:  General: No swelling, tenderness or deformity. Normal range of motion.  Cervical back: Normal range of motion and neck supple.  Skin: General: Skin is warm and dry.  Coloration: Skin is not jaundiced.  Neurological:  General: No focal deficit present.  Mental Status: She is alert and oriented to person, place, and time.  Psychiatric:  Mood and Affect: Mood normal.  Behavior: Behavior  normal.     Breast: There is a 6 cm fatty feeling mobile mass in the lateral aspect of the left breast. There are no overlying skin changes. There is no palpable mass in the right breast. There is no palpable axillary, supraclavicular, or cervical lymphadenopathy  Labs, Imaging and Diagnostic Testing:  Assessment and Plan:   Diagnoses and all orders for this visit:  Lipoma of breast - [facility]  Case Posting Request; Future   The patient appears to have a 6 cm lipoma in the lateral aspect of the left breast. Because it is getting larger I feel it would be reasonable to remove it. She would also like to have this done. I have discussed with her in detail the risks and benefits of the operation as well as some of the technical aspects and she understands and wishes to proceed.

## 2023-08-30 ENCOUNTER — Encounter (HOSPITAL_COMMUNITY): Payer: Self-pay | Admitting: General Surgery

## 2023-08-30 LAB — SURGICAL PATHOLOGY

## 2023-09-15 NOTE — Progress Notes (Signed)
 PROVIDER:  DEWARD GARNETTE NULL, MD  MRN: [id number] DOB: 21-Nov-1970 DATE OF ENCOUNTER: 09/15/2023 Subjective     Chief Complaint: Post Operative Visit     History of Present Illness: Jillian Howard is a 52 y.o. female who is seen today for lipoma of the left breast.  The patient is a 52 year old white female who is about 3 weeks status post excision of a lipoma from the left breast which was benign.  She tolerated the surgery well.  She has noticed some swelling at the site.  She reports some soreness but no acute pain.     Review of Systems: A complete review of systems was obtained from the patient.  I have reviewed this information and discussed as appropriate with the patient.  See HPI as well for other ROS.  ROS    Medical History: History reviewed. No pertinent past medical history.  Patient Active Problem List  Diagnosis  . Lipoma of breast    Past Surgical History:  Procedure Laterality Date  . Knee Surgery  2003  . CESAREAN SECTION     x3  2007, 2009, 2011  . MASTECTOMY PARTIAL / LUMPECTOMY       No Known Allergies  No current outpatient medications on file prior to visit.   No current facility-administered medications on file prior to visit.    Family History  Problem Relation Age of Onset  . High blood pressure (Hypertension) Mother      Social History   Tobacco Use  Smoking Status Never  Smokeless Tobacco Never     Social History   Socioeconomic History  . Marital status: Married  Tobacco Use  . Smoking status: Never  . Smokeless tobacco: Never  Substance and Sexual Activity  . Alcohol use: Never  . Drug use: Never    Objective:    Vitals:   09/15/23 0904  PainSc: 0-No pain    There is no height or weight on file to calculate BMI.  Physical Exam Vitals reviewed.  Constitutional:      General: She is not in acute distress.    Appearance: Normal appearance.  HENT:     Head: Normocephalic and atraumatic.     Right Ear:  External ear normal.     Left Ear: External ear normal.     Nose: Nose normal.     Mouth/Throat:     Mouth: Mucous membranes are moist.     Pharynx: Oropharynx is clear.  Eyes:     General: No scleral icterus.    Extraocular Movements: Extraocular movements intact.     Conjunctiva/sclera: Conjunctivae normal.     Pupils: Pupils are equal, round, and reactive to light.  Cardiovascular:     Rate and Rhythm: Normal rate and regular rhythm.     Pulses: Normal pulses.     Heart sounds: Normal heart sounds.  Pulmonary:     Effort: Pulmonary effort is normal. No respiratory distress.     Breath sounds: Normal breath sounds.  Abdominal:     General: Bowel sounds are normal.     Palpations: Abdomen is soft.     Tenderness: There is no abdominal tenderness.  Musculoskeletal:        General: No swelling, tenderness or deformity. Normal range of motion.     Cervical back: Normal range of motion and neck supple.  Skin:    General: Skin is warm and dry.     Coloration: Skin is not jaundiced.  Neurological:  General: No focal deficit present.     Mental Status: She is alert and oriented to person, place, and time.  Psychiatric:        Mood and Affect: Mood normal.        Behavior: Behavior normal.      Breast: The lateral left breast incision is healing nicely with no sign of infection.  There is a small seroma at the operative site which is not unexpected.   Labs, Imaging and Diagnostic Testing:     Assessment and Plan:     Diagnoses and all orders for this visit:  Lipoma of breast     The patient is about 3 weeks status post left breast lumpectomy for a benign lipoma.  She does have a small seroma at the operative site.  At this point I would like to see if the seroma will resolve on its own.  I will see her back in 1 month to check her progress.  If it does not resolve then we will try to aspirated at that time.  She may return to activity as tolerated.  Return in about  1 month (around 10/15/2023).   DEWARD GARNETTE NULL, MD    I had direct face-to-face contact with the patient for a total of 20 minutes and greater than 50% of that time was spent providing counseling and/or coordination of care for the patient regarding left breast lipoma.

## 2024-01-31 NOTE — Progress Notes (Signed)
 Jillian Howard is a 53 y.o.  with  reports that she has never smoked. She has never used smokeless tobacco. with  Active Ambulatory Problems    Diagnosis Date Noted  . History of anemia 06/21/2022  . Lipoma of breast 08/02/2023  . Other fatigue 06/21/2022  . Stress 06/21/2022  . Vitamin D  deficiency 09/29/2019  . Weight gain 06/21/2022   Resolved Ambulatory Problems    Diagnosis Date Noted  . No Resolved Ambulatory Problems   No Additional Past Medical History   who presents today at Urgent Care for  Chief Complaint  Patient presents with  . Nausea  . Vomiting    Started yesterday and now has a really bad headache from not being able to eat. Ad full body aches as well but those have now dissipated. No able to hold any OTC meds down.      C/o worsening n/v/d since yesterday. Unable to tolerate liquids or solids. Decreased appetite, body aches, headache. Denies h/o previous abd issues or surgeries, constipation, fever, hematochezia, melena, issues w/ defecation or urination.       reports that she has never smoked. She has never used smokeless tobacco.  Review of Systems  Constitutional:  Positive for activity change, appetite change and fatigue. Negative for chills and fever.  Respiratory:  Negative for shortness of breath.   Cardiovascular:  Negative for chest pain.  Gastrointestinal:  Positive for diarrhea, nausea and vomiting. Negative for abdominal pain, blood in stool and constipation.  Genitourinary:  Negative for decreased urine volume, dysuria, frequency, hematuria and urgency.  Musculoskeletal:  Negative for myalgias.  Skin:  Negative for color change.  Neurological:  Positive for headaches. Negative for dizziness.  Psychiatric/Behavioral:  Negative for agitation and confusion.      Physical Exam  BP 104/75   Pulse 61   Temp 98.1 F (36.7 C) (Tympanic)   Resp 16   Ht 1.575 m (5' 2)   Wt 53.5 kg (118 lb)   SpO2 99%   BMI 21.58 kg/m   Constitutional:       General: Patient is in acute distress. Laying on exam table w/ moderate nausea    Appearance: Normal appearance.  Neurological:     General: No focal deficit present.     Mental Status: alert and oriented to person, place, and time.  Cardiovascular:     Heart sounds: Normal heart sounds. No murmur heard.    No Lower extremity edema noted Pulmonary:     Effort: Pulmonary effort is normal. No respiratory distress.     Breath sounds: No wheezing, rhonchi or rales.  Abdominal:     General: Bowel sounds are normal. There is no distension.     Tenderness: There is mild diffuse abdominal tenderness w/o localized pain, rebound or guarding     Mild dryness and pallor of lip mucosa.  Musculoskeletal:        General: Normal range of motion.     Neck:  No rigidity.  Skin:    General: pallor.  Psychiatric:        Mood and Affect: Mood normal.   No orders to display      DIAGNOSIS/PLAN     1. Nausea and vomiting, unspecified vomiting type  ondansetron  (ZOFRAN ) injection 4 mg   sodium chloride (bolus) 0.9 % bolus 1,000 mL   POCT Metabolic Panel, Comprehensive   POC SARS-COV-2 SYMPTOMATIC (IDNOW)   POC Influenza A&B NAT (IDNOW)   ondansetron  (ZOFRAN -ODT) 4 mg disintegrating tablet  promethazine (PHENERGAN) 25 mg suppository    2. Gastroenteritis       Ddx - viral gastroenteritis, flu/covid  Flu and covid were neg and pt had no URI symptoms. Not likely to be flu or covid. Pt was moderately ill. Symptoms improved significantly after 1L NS IV, zofran  IV. Rx ODT zofran  w/ promethazine rectal suppository for refractory vomiting that fails zofran . Instructions given. Bland diet, rest and adequate hydration discussed. ED precautions also discussed.     We discussed risks and side effects of medications, and also discussed red flags which would warrant immediate follow-up.   Urgent Care Disposition:  Home Care

## 2024-03-16 NOTE — Progress Notes (Addendum)
 Subjective   Jillian Howard is a 53 y.o. female who presents for an annual exam.  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 Has 3 teen daughters, oldest about to go to college. Reports area of RUQ with protrusion and pain at times. Reports Hx of hernia repair, unsure of specifics as they are not noted in chart beyond hernia repair in 2012. Nonreproducable in clinic today.  Health Maintenance: Last wellness visit:  more than 1 year ago Diet:  general Calcium supplementation:  never Vitamin D  supplementation:  Hx deficiency Exercise frequency:  frequently Exercise type:  housecleaning, walking, yard work, and working 2 jobs Pap: was normal - OBGYN 5 yrs Mammogram:  was normal - 06/2023 DEXA:  NA Colonoscopy:  Yes, 2020 - 10 year repeat  Patient Active Problem List  Diagnosis  . History of anemia  . Lipoma of breast  . Other fatigue  . Vitamin D  deficiency  . Stress   Outpatient Medications Marked as Taking for the 03/16/24 encounter (Office Visit) with Megan G Bradley, FNP  Medication Sig Dispense Refill  . Methocarbamol 1000 MG TABS     . predniSONE 5 MG (21) TBPK Take by mouth.     Allergies Patient has no known allergies.  Review of Systems - All other systems were reviewed and are negative unless stated in HPI.  Family History  Problem Relation Age of Onset  . Kidney cancer Mother    History reviewed. No pertinent past medical history. Past Surgical History:  Procedure Laterality Date  . Breast lumpectomy Left 2024  . Cesarean section  2007   2009, 2011  . Colonoscopy  2020  . Hernia repair  2012  . Knee surgery Right 2003  . Lymph node dissection  2008   Pediatric History  Patient Parents  . Not on file   Other Topics Concern  . Not on file  Social History Narrative  . Not on file    has a past surgical history that includes Knee surgery (Right, 2003); Cesarean section (2007);  Lymph node dissection (2008); Hernia repair (2012); Colonoscopy (2020); and Breast lumpectomy (Left, 2024).  Objective   BP 103/68 (BP Location: Left Upper Arm, Patient Position: Sitting)   Pulse 55   Temp 96.9 F (36.1 C) (Temporal)   Resp 16   Ht 5' 2.5 (1.588 m)   Wt 116 lb 9.6 oz (52.9 kg)   SpO2 100%   Breastfeeding No   BMI 20.99 kg/m   General: The patient is a 53 y.o. female who appears to be in no acute distress. Psych: She is alert and oriented to person, place, and time. Her mood and affect are normal. HEENT: Normocephalic, atraumatic, non-icteric sclera, PERRL.  Tympanic membranes are without perforation or infection.  Nasopharynx is grossly normal.  Oropharynx is without mass or exudate. Neck:  Supple.  Trachea is in midline position.  The neck is without adenopathy, masses, or thyromegaly. Lungs:  Good breath sounds are noted bilaterally.  The lungs are clear to auscultation and percussion bilaterally.  The spine and CVA region are  nontender to palpation. Cardio:  Regular rate and rhythm without gallop, rub, or murmur.  Abdomen:  Bowel sounds are physiologic.  The abdomen is soft and nontender to palpation.  No masses are noted.  No hepatosplenomegaly is noted.  Skin:  No rashes or worrisome lesions are noted.  Extremities:  The extremities are  without cyanosis or significant contusions.  Pitting edema is not noted.  ROM is normal in all four extremities. Pulses: Adequate pulses are noted in all four extremities and both carotid arteries. Neuro:  Mental status is normal.  CN 2-11 are grossly normal.  Motor and sensory exams are grossly normal.  DTR are physiologic in all extremities.  Gait is stable. Breast Exam:  Deferred Pelvic:  Deferred   Impression     ICD-10-CM   1. History of anemia  Z86.2 Anemia Profile B    Anemia Profile B    2. Vitamin D  deficiency  E55.9 Vitamin D  25 Hydroxy    Vitamin D  25 Hydroxy    3. Annual physical exam  Z00.00     4. Screening  for diabetes mellitus  Z13.1 Comprehensive Metabolic Panel    Comprehensive Metabolic Panel    5. Lipid screening  Z13.220 Lipid Panel With LDL/HDL Ratio    Lipid Panel With LDL/HDL Ratio    6. Abdominal mass, right upper quadrant  R19.01 US  Abdominal Wall      Plan    Orders Placed This Encounter  Procedures  . US  Abdominal Wall  . Comprehensive Metabolic Panel  . Lipid Panel With LDL/HDL Ratio  . Anemia Profile B  . Vitamin D  25 Hydroxy    - Health maintenance issues including appropriate cancer screening, healthy diet, exercise and tobacco avoidance were discussed with the patient.  I've encouraged healthy lifestyle modifications of eating fruits/vegetables, decreased fat intake, regular daily exercise, and decrease stress.  - Risks, benefits, and alternatives of the medications and treatment plan prescribed today were discussed, and patient expressed understanding.  - Pap smear and breast exam are done at her ob/gyn.  - Labs ordered today: cbc/d,cmp,tsh,vitamin d ,lipid panel.  We will call pt with results. - Follow up in about 1 year (around 03/18/2025) for Annual physical with fasting labs and refills. - Return to clinic to be reevaluated if symptoms worsen, persist, change, or if you have any other concerns. - I discussed this diagnosis with the patient and discussed the treatment plan with them. This treatment plan is also outlined in the Patient Instructions and a copy of this was provided to the patient.
